# Patient Record
Sex: Female | Born: 1985 | Race: White | Hispanic: No | Marital: Single | Smoking: Current every day smoker
Health system: Southern US, Community
[De-identification: ages and names within clinical notes are randomized; demographics above are authoritative.]

## PROBLEM LIST (undated history)

## (undated) HISTORY — PX: LEG SURGERY: SHX1003

---

## 2008-11-03 ENCOUNTER — Emergency Department (HOSPITAL_COMMUNITY): Admission: EM | Admit: 2008-11-03 | Discharge: 2008-11-03 | Payer: Self-pay | Admitting: Emergency Medicine

## 2009-06-06 ENCOUNTER — Emergency Department (HOSPITAL_COMMUNITY): Admission: EM | Admit: 2009-06-06 | Discharge: 2009-06-06 | Payer: Self-pay | Admitting: Emergency Medicine

## 2010-04-12 ENCOUNTER — Emergency Department (HOSPITAL_COMMUNITY): Admission: EM | Admit: 2010-04-12 | Discharge: 2010-04-12 | Payer: Self-pay | Admitting: Emergency Medicine

## 2010-07-29 ENCOUNTER — Other Ambulatory Visit (HOSPITAL_COMMUNITY): Payer: Self-pay | Admitting: Physician Assistant

## 2010-07-29 DIAGNOSIS — R772 Abnormality of alphafetoprotein: Secondary | ICD-10-CM

## 2010-07-29 DIAGNOSIS — Z0489 Encounter for examination and observation for other specified reasons: Secondary | ICD-10-CM

## 2010-08-03 ENCOUNTER — Ambulatory Visit (HOSPITAL_COMMUNITY): Payer: Medicaid Other

## 2010-08-07 LAB — URINALYSIS, ROUTINE W REFLEX MICROSCOPIC
Ketones, ur: NEGATIVE mg/dL
Nitrite: NEGATIVE
Protein, ur: NEGATIVE mg/dL
pH: 6 (ref 5.0–8.0)

## 2010-08-07 LAB — URINE CULTURE: Colony Count: 7000

## 2010-08-07 LAB — URINE MICROSCOPIC-ADD ON

## 2011-05-26 IMAGING — CR DG CHEST 2V
2 series · 2 of 2 positions shown · non-contrast
Comparison: None.

CLINICAL DATA: Cough, chest congestion, and chills for 3 days.

CHEST - 2 VIEW

[view not recorded (1 of 2)]
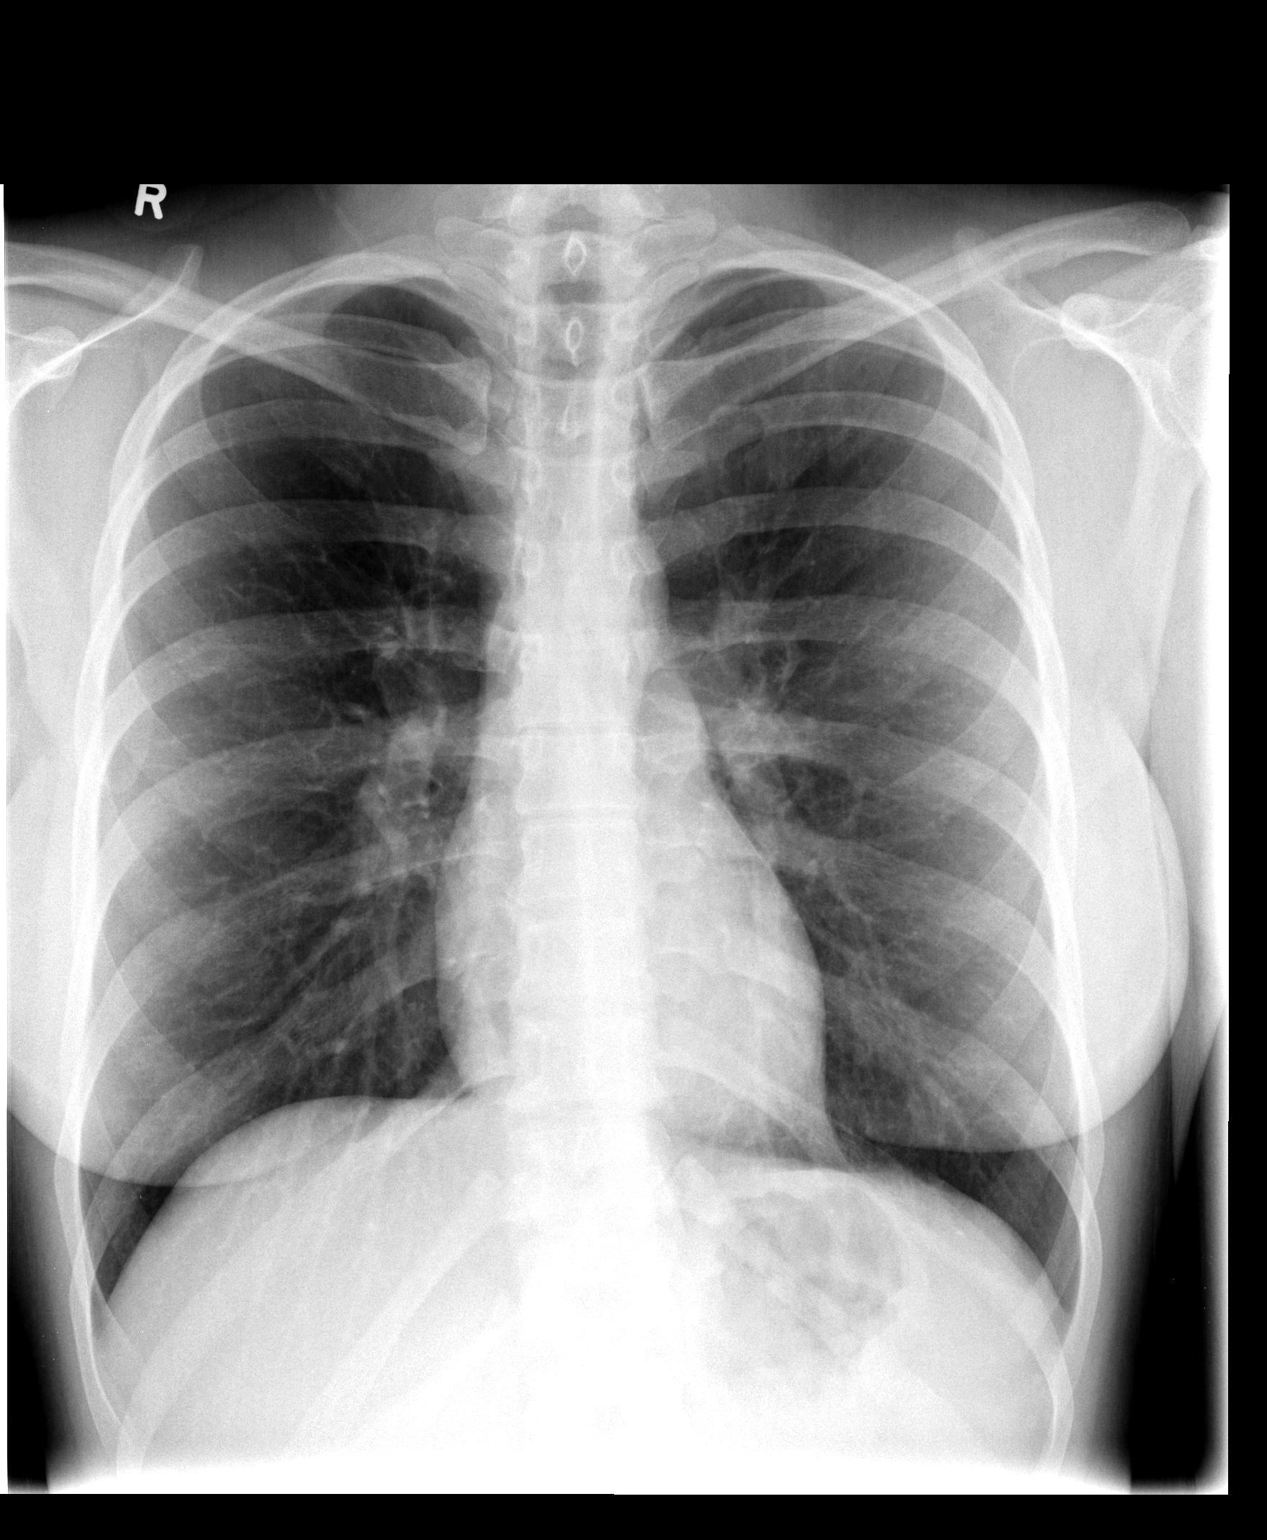

[view not recorded (2 of 2)]
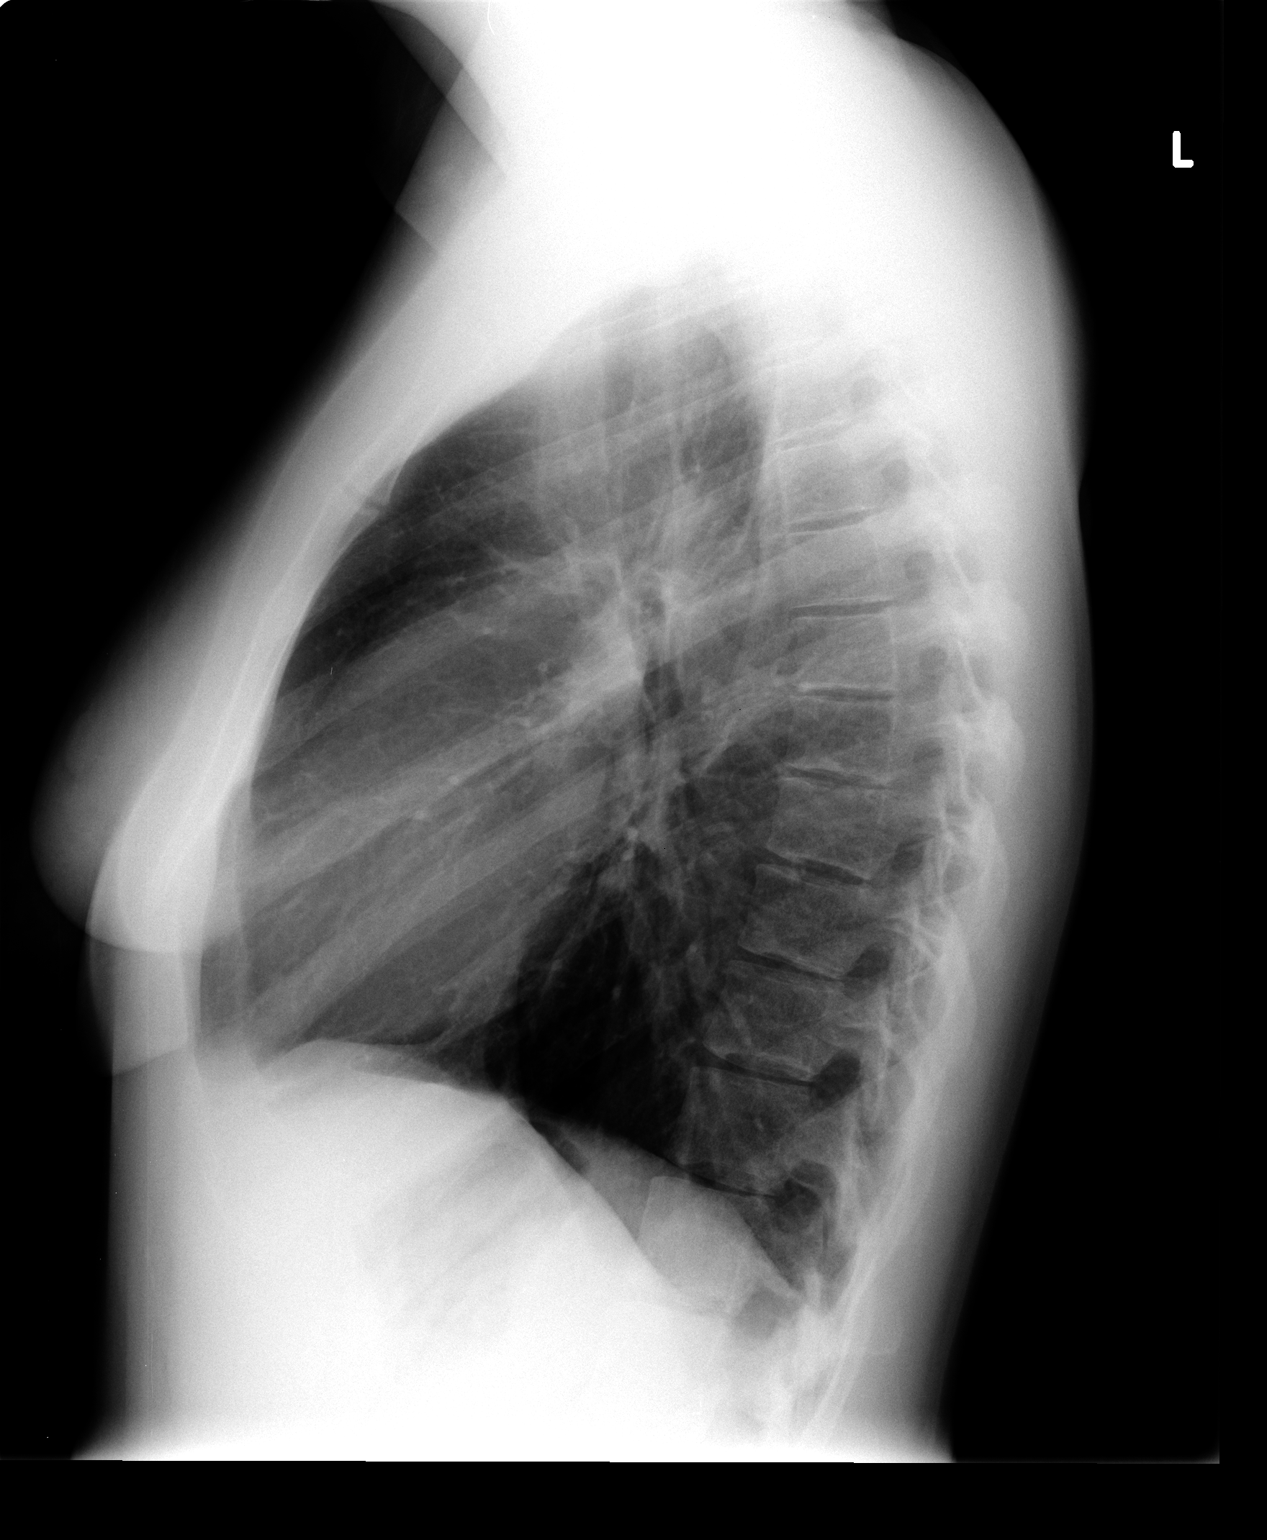

[2 of 2 positions shown; findings below may reference images not displayed]

FINDINGS: The heart size and vascularity are normal and the lungs
are clear.  No osseous abnormality.
IMPRESSION: Normal chest.

## 2011-09-22 ENCOUNTER — Emergency Department (HOSPITAL_COMMUNITY)
Admission: EM | Admit: 2011-09-22 | Discharge: 2011-09-22 | Disposition: A | Discharge status: Home / Self Care | Payer: Medicaid Other | Attending: Emergency Medicine | Admitting: Emergency Medicine

## 2011-09-22 ENCOUNTER — Encounter (HOSPITAL_COMMUNITY): Payer: Self-pay | Admitting: Emergency Medicine

## 2011-09-22 DIAGNOSIS — N390 Urinary tract infection, site not specified: Secondary | ICD-10-CM | POA: Insufficient documentation

## 2011-09-22 DIAGNOSIS — R42 Dizziness and giddiness: Secondary | ICD-10-CM | POA: Insufficient documentation

## 2011-09-22 DIAGNOSIS — F17200 Nicotine dependence, unspecified, uncomplicated: Secondary | ICD-10-CM | POA: Insufficient documentation

## 2011-09-22 DIAGNOSIS — E860 Dehydration: Secondary | ICD-10-CM | POA: Insufficient documentation

## 2011-09-22 DIAGNOSIS — R5381 Other malaise: Secondary | ICD-10-CM | POA: Insufficient documentation

## 2011-09-22 DIAGNOSIS — R630 Anorexia: Secondary | ICD-10-CM | POA: Insufficient documentation

## 2011-09-22 DIAGNOSIS — R109 Unspecified abdominal pain: Secondary | ICD-10-CM | POA: Insufficient documentation

## 2011-09-22 LAB — URINALYSIS, ROUTINE W REFLEX MICROSCOPIC
Nitrite: NEGATIVE
Protein, ur: NEGATIVE mg/dL
Urobilinogen, UA: 0.2 mg/dL (ref 0.0–1.0)

## 2011-09-22 LAB — POCT PREGNANCY, URINE: Preg Test, Ur: NEGATIVE

## 2011-09-22 MED ORDER — SULFAMETHOXAZOLE-TRIMETHOPRIM 800-160 MG PO TABS: 1.0000 | ORAL_TABLET | Freq: Two times a day (BID) | ORAL | Status: AC

## 2011-09-22 NOTE — ED Notes (Signed)
Pt ambulated in hall with no difficulty. No c/o dizziness or nausea at this time. Pt taking sips of her drink.

## 2011-09-22 NOTE — Discharge Instructions (Signed)
Dehydration, Adult Dehydration is when you lose more fluids from the body than you take in. Vital organs like the kidneys, brain, and heart cannot function without a proper amount of fluids and salt. Any loss of fluids from the body can cause dehydration.  CAUSES   Vomiting.   Diarrhea.   Excessive sweating.   Excessive urine output.   Fever.  SYMPTOMS  Mild dehydration  Thirst.   Dry lips.   Slightly dry mouth.  Moderate dehydration  Very dry mouth.   Sunken eyes.   Skin does not bounce back quickly when lightly pinched and released.   Dark urine and decreased urine production.   Decreased tear production.   Headache.  Severe dehydration  Very dry mouth.   Extreme thirst.   Rapid, weak pulse (more than 100 beats per minute at rest).   Cold hands and feet.   Not able to sweat in spite of heat and temperature.   Rapid breathing.   Blue lips.   Confusion and lethargy.   Difficulty being awakened.   Minimal urine production.   No tears.  DIAGNOSIS  Your caregiver will diagnose dehydration based on your symptoms and your exam. Blood and urine tests will help confirm the diagnosis. The diagnostic evaluation should also identify the cause of dehydration. TREATMENT  Treatment of mild or moderate dehydration can often be done at home by increasing the amount of fluids that you drink. It is best to drink small amounts of fluid more often. Drinking too much at one time can make vomiting worse. Refer to the home care instructions below. Severe dehydration needs to be treated at the hospital where you will probably be given intravenous (IV) fluids that contain water and electrolytes. HOME CARE INSTRUCTIONS   Ask your caregiver about specific rehydration instructions.   Drink enough fluids to keep your urine clear or pale yellow.   Drink small amounts frequently if you have nausea and vomiting.   Eat as you normally do.   Avoid:   Foods or drinks high in  sugar.   Carbonated drinks.   Juice.   Extremely hot or cold fluids.   Drinks with caffeine.   Fatty, greasy foods.   Alcohol.   Tobacco.   Overeating.   Gelatin desserts.   Wash your hands well to avoid spreading bacteria and viruses.   Only take over-the-counter or prescription medicines for pain, discomfort, or fever as directed by your caregiver.   Ask your caregiver if you should continue all prescribed and over-the-counter medicines.   Keep all follow-up appointments with your caregiver.  SEEK MEDICAL CARE IF:  You have abdominal pain and it increases or stays in one area (localizes).   You have a rash, stiff neck, or severe headache.   You are irritable, sleepy, or difficult to awaken.   You are weak, dizzy, or extremely thirsty.  SEEK IMMEDIATE MEDICAL CARE IF:   You are unable to keep fluids down or you get worse despite treatment.   You have frequent episodes of vomiting or diarrhea.   You have blood or green matter (bile) in your vomit.   You have blood in your stool or your stool looks black and tarry.   You have not urinated in 6 to 8 hours, or you have only urinated a small amount of very dark urine.   You have a fever.   You faint.  MAKE SURE YOU:   Understand these instructions.   Will watch your condition.     Will get help right away if you are not doing well or get worse.  Document Released: 05/08/2005 Document Revised: 04/27/2011 Document Reviewed: 12/26/2010 [employer] Patient Information 2012 [employer]. 

## 2011-09-22 NOTE — ED Notes (Signed)
Pt states has had issues with bp being low. Checked it yesterday at home and sys 80. C/o generalized weakness/being tired and dizziness with movment x 3 days. Nad at this time.

## 2011-09-22 NOTE — ED Provider Notes (Signed)
History   This chart was scribed for Joya Gaskins, MD by Shari Heritage. The patient was seen in room APA06/APA06. Patient's care was started at 1005.     CSN: [id number]  Arrival date & time 09/22/11  1005   First MD Initiated Contact with Patient 09/22/11 1017      Chief Complaint  Patient presents with  . Hypotension    The history is provided by the patient. No language interpreter was used.   Diana Barrera is a 26 y.o. female who presents to the Emergency Department complaining of intermittent episodes hypotension onset 4 days ago associated with dizziness, lightheadedness, abdominal pain, loss of appetite, and fatigue. States that fatigue worsened last night at which time she measured her blood pressure and obtained a measure of 78/50. Patient reports that she ingested salt with her food to try to elevate her blood pressure. Patient denies fever, vomiting, vaginal bleeding, headache and chest pain. Patient with h/o of hypotension while pregnant and is a current smoker.  Past Medical History  Diagnosis Date  . Hypotension     Past Surgical History  Procedure Date  . Leg surgery     History reviewed. No pertinent family history.  History  Substance Use Topics  . Smoking status: Current Everyday Smoker  . Smokeless tobacco: Not on file  . Alcohol Use: Yes     occasional    OB History    Grav Para Term Preterm Abortions TAB SAB Ect Mult Living                  Review of Systems A complete 10 system review of systems was obtained and all systems are negative except as noted in the HPI and PMH.   Allergies  Review of patient's allergies indicates not on file.  Home Medications  No current outpatient prescriptions on file.  BP 103/65  Pulse 83  Temp(Src) 97.9 F (36.6 C) (Oral)  Resp 18  Ht 5\' 7"  (1.702 m)  Wt 148 lb (67.132 kg)  BMI 23.18 kg/m2  SpO2 99%  LMP 09/15/2011 BP 92/64  Pulse 92  Temp(Src) 97.9 F (36.6 C) (Oral)  Resp 18  Ht 5\' 7"   (1.702 m)  Wt 148 lb (67.132 kg)  BMI 23.18 kg/m2  SpO2 99%  LMP 09/15/2011   Physical Exam CONSTITUTIONAL: Well developed/well nourished HEAD AND FACE: Normocephalic/atraumatic EYES: EOMI/PERRL ENMT: Mucous membranes moist NECK: supple no meningeal signs SPINE:entire spine nontender CV: S1/S2 noted, no murmurs/rubs/gallops noted LUNGS: Lungs are clear to auscultation bilaterally, no apparent distress ABDOMEN: soft, nontender, no rebound or guarding GU:no cva tenderness NEURO: Pt is awake/alert, moves all extremitiesx4 EXTREMITIES: pulses normal, full ROM SKIN: warm, color normal PSYCH: no abnormalities of mood noted  ED Course  Procedures  DIAGNOSTIC STUDIES: Oxygen Saturation is 99% on room air, normal by my interpretation.    COORDINATION OF CARE: 10:35AM-Patient informed of current plan for treatment and evaluation and agrees with plan at this time.     Labs Reviewed  URINALYSIS, ROUTINE W REFLEX MICROSCOPIC - Abnormal; Notable for the following:    Leukocytes, UA TRACE (*)    All other components within normal limits  URINE MICROSCOPIC-ADD ON - Abnormal; Notable for the following:    Squamous Epithelial / LPF FEW (*)    Bacteria, UA MANY (*)    All other components within normal limits  POCT PREGNANCY, URINE   Pt well appearing, ambulatory, possible uti, but not septic appearing, SBP > 90  entire stay, not tachycardic or febrile Advised PO fluids, rest and discussed strict return precautions.  I doubt her BP is from her baseline  The patient appears reasonably screened and/or stabilized for discharge and I doubt any other medical condition or other [facility] requiring further screening, evaluation, or treatment in the ED at this time prior to discharge.    MDM  Nursing notes reviewed and considered in documentation All labs/vitals reviewed and considered  .  Date: 09/22/2011  Rate: 62  Rhythm: normal sinus rhythm  QRS Axis: normal  Intervals: normal  ST/T  Wave abnormalities: nonspecific ST changes  Conduction Disutrbances:none  Narrative Interpretation:   Old EKG Reviewed: none available at time of interpretation       I personally performed the services described in this documentation, which was scribed in my presence. The recorded information has been reviewed and considered.      Joya Gaskins, MD 09/22/11 [phone]

## 2012-04-19 ENCOUNTER — Emergency Department (HOSPITAL_COMMUNITY)
Admission: EM | Admit: 2012-04-19 | Discharge: 2012-04-19 | Disposition: A | Discharge status: Home / Self Care | Payer: 59 | Attending: Emergency Medicine | Admitting: Emergency Medicine

## 2012-04-19 ENCOUNTER — Encounter (HOSPITAL_COMMUNITY): Payer: Self-pay | Admitting: *Deleted

## 2012-04-19 DIAGNOSIS — F17200 Nicotine dependence, unspecified, uncomplicated: Secondary | ICD-10-CM | POA: Insufficient documentation

## 2012-04-19 DIAGNOSIS — F419 Anxiety disorder, unspecified: Secondary | ICD-10-CM

## 2012-04-19 DIAGNOSIS — I959 Hypotension, unspecified: Secondary | ICD-10-CM | POA: Insufficient documentation

## 2012-04-19 DIAGNOSIS — F411 Generalized anxiety disorder: Secondary | ICD-10-CM | POA: Insufficient documentation

## 2012-04-19 DIAGNOSIS — R0789 Other chest pain: Secondary | ICD-10-CM | POA: Insufficient documentation

## 2012-04-19 MED ORDER — LORAZEPAM 1 MG PO TABS: 1.0000 mg | ORAL_TABLET | Freq: Three times a day (TID) | ORAL | Status: DC | PRN

## 2012-04-19 MED ORDER — LORAZEPAM 1 MG PO TABS
1.0000 mg | ORAL_TABLET | Freq: Once | ORAL | Status: AC
  Administered 2012-04-19: 1 mg via ORAL
  Filled 2012-04-19: qty 1

## 2012-04-19 NOTE — ED Notes (Signed)
Discharge instructions reviewed with pt, questions answered. Pt verbalized understanding.  

## 2012-04-19 NOTE — ED Notes (Signed)
MD at bedside. 

## 2012-04-19 NOTE — ED Provider Notes (Signed)
History   This chart was scribed for Flint Melter, MD by Melba Coon, ED Scribe. The patient was seen in room APA02/APA02 and the patient's care was started at 5:54PM.    CSN: [id number]  Arrival date & time 04/19/12  1558   First MD Initiated Contact with Patient 04/19/12 1650      Chief Complaint  Patient presents with  . Panic Attack    (Consider location/radiation/quality/duration/timing/severity/associated sxs/prior treatment) The history is provided by the patient and a relative (grandfather). No language interpreter was used.   Diana Barrera is a 26 y.o. female who presents to the Emergency Department complaining of a possible panic attack with an onset around 2:00PM today. She reports that during the incident, there was tremor, inability to concentrate, and chest tightness with intermittent SOB that is not present at exam. She has a hx of mild panic attacks but has never experienced anything like what she experienced today. She reports she always feels stressed and anxious, but has not had professional help. She reports that she was going to see a psychiatrist after a referral from her OB/GYN when she was having her last child. She reports that taking Celexa int he past did not alleviated her nervousness or anxiety and reports it made her feel worse than before she started taking it. Grandfather reports that he and Diana Barrera "clash" at home and that she is having problems with her child's father. She reports decreased appetite and decreased sleep. She denies suicidal or homicidal ideation. No known allergies. No other pertinent medical symptoms.   Past Medical History  Diagnosis Date  . Hypotension     Past Surgical History  Procedure Date  . Leg surgery     History reviewed. No pertinent family history.  History  Substance Use Topics  . Smoking status: Current Every Day Smoker  . Smokeless tobacco: Not on file  . Alcohol Use: Yes     Comment: occasional    OB  History    Grav Para Term Preterm Abortions TAB SAB Ect Mult Living                  Review of Systems 10 Systems reviewed and all are negative for acute change except as noted in the HPI.   Allergies  Review of patient's allergies indicates no known allergies.  Home Medications   Current Outpatient Rx  Name  Route  Sig  Dispense  Refill  . LORAZEPAM 1 MG PO TABS   Oral   Take 1 tablet (1 mg total) by mouth 3 (three) times daily as needed for anxiety.   20 tablet   0     BP 113/71  Pulse 94  Temp 98.1 F (36.7 C) (Oral)  Resp 23  Ht 5\' 7"  (1.702 m)  Wt 155 lb (70.308 kg)  BMI 24.28 kg/m2  SpO2 100%  LMP 04/05/2012  Physical Exam  Nursing note and vitals reviewed. Constitutional:       Awake, alert, nontoxic appearance.  HENT:  Head: Atraumatic.  Eyes: Right eye exhibits no discharge. Left eye exhibits no discharge.  Neck: Neck supple.  Pulmonary/Chest: Effort normal. She exhibits no tenderness.  Abdominal: Soft. There is no tenderness. There is no rebound.  Musculoskeletal: She exhibits no tenderness.       Baseline ROM, no obvious new focal weakness.  Neurological:       Mental status and motor strength appears baseline for patient and situation.  Skin: No rash noted.  Psychiatric: Her mood appears anxious.    ED Course  Procedures (including critical care time)  DIAGNOSTIC STUDIES: Oxygen Saturation is 99% on room air, normal by my interpretation.    COORDINATION OF CARE:  5:58PM - Ativan will be Rx for Diana Barrera and is advised to f/u with a psychiatrist at [facility]. She is ready for d/c.    Date: 03/08/2012  Rate: 99  Rhythm: normal sinus rhythm and sinus arrhythymia  QRS Axis: normal  PR and QT Intervals: normal  ST/T Wave abnormalities: normal  PR and QRS Conduction Disutrbances:none  Narrative Interpretation:   Old EKG Reviewed: unchanged   Nursing notes, applicable records and vitals reviewed.  Radiologic Images/Reports  reviewed.        1. Anxiety       MDM  Evaluation consistent with anxiety. Doubt ACS, PE, or pneumonia. Doubt metabolic instability, serious bacterial infection or impending vascular collapse; the patient is stable for discharge.  I personally performed the services described in this documentation, which was scribed in my presence. The recorded information has been reviewed and is accurate.     Plan: Home Medications- Ativan; Home Treatments- rest; Recommended follow up- Counseling asap       Flint Melter, MD 04/20/12 0107

## 2012-04-19 NOTE — ED Notes (Signed)
Pt stated around 2pm started feeling shaky and unable to concentrate, feels tight in chest with SOB at times. Has hx of mild panic attacks but "nothing like this".

## 2014-11-19 ENCOUNTER — Emergency Department (HOSPITAL_COMMUNITY)
Admission: EM | Admit: 2014-11-19 | Discharge: 2014-11-19 | Disposition: A | Discharge status: Home / Self Care | Payer: Self-pay | Attending: Emergency Medicine | Admitting: Emergency Medicine

## 2014-11-19 ENCOUNTER — Encounter (HOSPITAL_COMMUNITY): Payer: Self-pay | Admitting: Emergency Medicine

## 2014-11-19 DIAGNOSIS — Z720 Tobacco use: Secondary | ICD-10-CM | POA: Insufficient documentation

## 2014-11-19 DIAGNOSIS — Z8679 Personal history of other diseases of the circulatory system: Secondary | ICD-10-CM | POA: Insufficient documentation

## 2014-11-19 DIAGNOSIS — N390 Urinary tract infection, site not specified: Secondary | ICD-10-CM | POA: Insufficient documentation

## 2014-11-19 DIAGNOSIS — Z3202 Encounter for pregnancy test, result negative: Secondary | ICD-10-CM | POA: Insufficient documentation

## 2014-11-19 LAB — URINE MICROSCOPIC-ADD ON

## 2014-11-19 LAB — URINALYSIS, ROUTINE W REFLEX MICROSCOPIC
Bilirubin Urine: NEGATIVE
Glucose, UA: NEGATIVE mg/dL
KETONES UR: NEGATIVE mg/dL
NITRITE: POSITIVE — AB
PH: 7 (ref 5.0–8.0)
Protein, ur: NEGATIVE mg/dL
Specific Gravity, Urine: 1.01 (ref 1.005–1.030)
Urobilinogen, UA: 0.2 mg/dL (ref 0.0–1.0)

## 2014-11-19 LAB — PREGNANCY, URINE: PREG TEST UR: NEGATIVE

## 2014-11-19 MED ORDER — CEPHALEXIN 500 MG PO CAPS: 500.0000 mg | ORAL_CAPSULE | Freq: Four times a day (QID) | ORAL | Status: AC

## 2014-11-19 MED ORDER — PHENAZOPYRIDINE HCL 100 MG PO TABS: 100.0000 mg | ORAL_TABLET | Freq: Three times a day (TID) | ORAL | Status: AC | PRN

## 2014-11-19 MED ORDER — PHENAZOPYRIDINE HCL 100 MG PO TABS
100.0000 mg | ORAL_TABLET | Freq: Once | ORAL | Status: AC
  Administered 2014-11-19: 100 mg via ORAL
  Filled 2014-11-19: qty 1

## 2014-11-19 MED ORDER — CEPHALEXIN 500 MG PO CAPS
500.0000 mg | ORAL_CAPSULE | Freq: Once | ORAL | Status: AC
  Administered 2014-11-19: 500 mg via ORAL
  Filled 2014-11-19: qty 1

## 2014-11-19 MED ORDER — IBUPROFEN 800 MG PO TABS
800.0000 mg | ORAL_TABLET | Freq: Once | ORAL | Status: AC
  Administered 2014-11-19: 800 mg via ORAL
  Filled 2014-11-19: qty 1

## 2014-11-19 MED ORDER — IBUPROFEN 600 MG PO TABS: 600.0000 mg | ORAL_TABLET | Freq: Four times a day (QID) | ORAL | Status: AC

## 2014-11-19 NOTE — Discharge Instructions (Signed)
Your tests suggest a urinary tract infection. Please use Keflex 500 mg 4 times daily until all taken. Please use Pyridium for urinary spasm and pain. Please see your primary physician, or physician at the health department for recheck of your urine in about 10 days. Please increase fluids. Urinary Tract Infection Urinary tract infections (UTIs) can develop anywhere along your urinary tract. Your urinary tract is your body's drainage system for removing wastes and extra water. Your urinary tract includes two kidneys, two ureters, a bladder, and a urethra. Your kidneys are a pair of bean-shaped organs. Each kidney is about the size of your fist. They are located below your ribs, one on each side of your spine. CAUSES Infections are caused by microbes, which are microscopic organisms, including fungi, viruses, and bacteria. These organisms are so small that they can only be seen through a microscope. Bacteria are the microbes that most commonly cause UTIs. SYMPTOMS  Symptoms of UTIs may vary by age and gender of the patient and by the location of the infection. Symptoms in young women typically include a frequent and intense urge to urinate and a painful, burning feeling in the bladder or urethra during urination. Older women and men are more likely to be tired, shaky, and weak and have muscle aches and abdominal pain. A fever may mean the infection is in your kidneys. Other symptoms of a kidney infection include pain in your back or sides below the ribs, nausea, and vomiting. DIAGNOSIS To diagnose a UTI, your caregiver will ask you about your symptoms. Your caregiver also will ask to provide a urine sample. The urine sample will be tested for bacteria and white blood cells. White blood cells are made by your body to help fight infection. TREATMENT  Typically, UTIs can be treated with medication. Because most UTIs are caused by a bacterial infection, they usually can be treated with the use of antibiotics.  The choice of antibiotic and length of treatment depend on your symptoms and the type of bacteria causing your infection. HOME CARE INSTRUCTIONS  If you were prescribed antibiotics, take them exactly as your caregiver instructs you. Finish the medication even if you feel better after you have only taken some of the medication.  Drink enough water and fluids to keep your urine clear or pale yellow.  Avoid caffeine, tea, and carbonated beverages. They tend to irritate your bladder.  Empty your bladder often. Avoid holding urine for long periods of time.  Empty your bladder before and after sexual intercourse.  After a bowel movement, women should cleanse from front to back. Use each tissue only once. SEEK MEDICAL CARE IF:   You have back pain.  You develop a fever.  Your symptoms do not begin to resolve within 3 days. SEEK IMMEDIATE MEDICAL CARE IF:   You have severe back pain or lower abdominal pain.  You develop chills.  You have nausea or vomiting.  You have continued burning or discomfort with urination. MAKE SURE YOU:   Understand these instructions.  Will watch your condition.  Will get help right away if you are not doing well or get worse. Document Released: 02/15/2005 Document Revised: 11/07/2011 Document Reviewed: 06/16/2011 [facility] Patient Information 2015 [place]. This information is not intended to replace advice given to you by your health care provider. Make sure you discuss any questions you have with your health care provider.

## 2014-11-19 NOTE — ED Notes (Signed)
Pt states urinary symptoms x 3-4 weeks, stating urinary frequency, burning, and pressure. Left flank pain began last night. NAD.

## 2014-11-19 NOTE — ED Notes (Signed)
Phoned lab re: results still pending. Lab stated they would check on it.

## 2014-11-19 NOTE — ED Notes (Signed)
Having problems voiding for last 3 weeks.  History of UTI's.  C/o pressure when voiding with frequent urination, denies burning.

## 2014-11-19 NOTE — ED Provider Notes (Signed)
CSN: [id number]     Arrival date & time 11/19/14  1309 History   First MD Initiated Contact with Patient 11/19/14 1345     Chief Complaint  Patient presents with  . Recurrent UTI  . Back Pain    only when voiding     (Consider location/radiation/quality/duration/timing/severity/associated sxs/prior Treatment) Patient is a 29 y.o. female presenting with dysuria. The history is provided by the patient.  Dysuria Pain quality:  Burning (pressure) Pain severity:  Moderate Duration:  3 weeks Timing:  Intermittent Progression:  Worsening Chronicity:  Chronic Relieved by:  Nothing Worsened by:  Nothing tried Urinary symptoms: discolored urine and frequent urination   Urinary symptoms: no hematuria and no bladder incontinence   Associated symptoms: flank pain   Associated symptoms: no fever, no nausea and no vomiting   Risk factors: no kidney transplant, no renal disease and not single kidney     Past Medical History  Diagnosis Date  . Hypotension    Past Surgical History  Procedure Laterality Date  . Leg surgery     History reviewed. No pertinent family history. History  Substance Use Topics  . Smoking status: Current Every Day Smoker  . Smokeless tobacco: Not on file  . Alcohol Use: Yes     Comment: occasional   OB History    No data available     Review of Systems  Constitutional: Negative for fever.  Gastrointestinal: Negative for nausea and vomiting.  Genitourinary: Positive for dysuria and flank pain.  Musculoskeletal: Positive for back pain.  All other systems reviewed and are negative.     Allergies  Review of patient's allergies indicates no known allergies.  Home Medications   Prior to Admission medications   Medication Sig Start Date End Date Taking? Authorizing Provider  LORazepam (ATIVAN) 1 MG tablet Take 1 tablet (1 mg total) by mouth 3 (three) times daily as needed for anxiety. 04/19/12   Mancel BaleElliott Wentz, MD   BP 105/65 mmHg  Pulse 71   Temp(Src) 98.1 F (36.7 C) (Oral)  Resp 16  Ht 5\' 7"  (1.702 m)  Wt 170 lb (77.111 kg)  BMI 26.62 kg/m2  SpO2 100%  LMP 11/19/2014 Physical Exam  Constitutional: She is oriented to person, place, and time. She appears well-developed and well-nourished.  Non-toxic appearance.  HENT:  Head: Normocephalic.  Right Ear: Tympanic membrane and external ear normal.  Left Ear: Tympanic membrane and external ear normal.  Eyes: EOM and lids are normal. Pupils are equal, round, and reactive to light.  Neck: Normal range of motion. Neck supple. Carotid bruit is not present.  Cardiovascular: Normal rate, regular rhythm, normal heart sounds, intact distal pulses and normal pulses.   Pulmonary/Chest: Breath sounds normal. No respiratory distress.  Abdominal: Soft. Bowel sounds are normal. She exhibits no shifting dullness, no distension, no ascites and no mass. There is no splenomegaly or hepatomegaly. There is no tenderness. There is CVA tenderness. There is no guarding.  Musculoskeletal: Normal range of motion.  Lymphadenopathy:       Head (right side): No submandibular adenopathy present.       Head (left side): No submandibular adenopathy present.    She has no cervical adenopathy.  Neurological: She is alert and oriented to person, place, and time. She has normal strength. No cranial nerve deficit or sensory deficit.  Skin: Skin is warm and dry.  Psychiatric: She has a normal mood and affect. Her speech is normal.  Nursing note and vitals reviewed.  ED Course  Procedures (including critical care time) Labs Review Labs Reviewed  URINALYSIS, ROUTINE W REFLEX MICROSCOPIC (NOT AT [facility])  PREGNANCY, URINE    Imaging Review No results found.   EKG Interpretation None      MDM  UA reveals UTI. Culture sent to the lab. Doubt pyelonephritis at this time. Rx for keflex given to the patient. Pt to have urine rechecked in 10 days.   Final diagnoses:  None    *I have reviewed nursing  notes, vital signs, and all appropriate lab and imaging results for this patient.    Ivery Quale, PA-C 11/21/14 1110  Vanetta Mulders, MD 11/21/14 [phone]

## 2014-11-21 LAB — URINE CULTURE: Culture: >=100000

## 2014-11-22 ENCOUNTER — Telehealth: Payer: Self-pay | Admitting: Emergency Medicine

## 2014-11-22 NOTE — Telephone Encounter (Signed)
Post ED Visit - Positive Culture Follow-up  Culture report reviewed by antimicrobial stewardship pharmacist: []  Wes Dulaney, Pharm.D., BCPS [x]  Celedonio MiyamotoJeremy Frens, [address]Ph[address] BCPS []  Georgina PillionElizabeth Martin, Pharm.D., BCPS []  WaldenMinh Pham, [place]Pharm.D., BCPS, AAHIVP []  Estella HuskMichelle Turner, Pharm.D., BCPS, AAHIVP []  Elder CyphersLorie Poole, [address]Ph[address]D., BCPS  Positive Urine culture Treated with Fosfomycin, organism sensitive to the same and no further patient follow-up is required at this time.  Jiles HaroldGammons, Vin Yonke Chaney 11/22/2014, 4:33 PM
